# Patient Record
Sex: Male | Born: 1990 | Race: White | Hispanic: No | Marital: Single | Smoking: Current every day smoker
Health system: Southern US, Community
[De-identification: ages and names within clinical notes are randomized; demographics above are authoritative.]

## PROBLEM LIST (undated history)

## (undated) DIAGNOSIS — F419 Anxiety disorder, unspecified: Secondary | ICD-10-CM

## (undated) DIAGNOSIS — T7840XA Allergy, unspecified, initial encounter: Secondary | ICD-10-CM

## (undated) DIAGNOSIS — F32A Depression, unspecified: Secondary | ICD-10-CM

## (undated) HISTORY — PX: SHOULDER SURGERY: SHX246

## (undated) HISTORY — DX: Anxiety disorder, unspecified: F41.9

## (undated) HISTORY — DX: Depression, unspecified: F32.A

## (undated) HISTORY — DX: Allergy, unspecified, initial encounter: T78.40XA

---

## 2011-09-15 ENCOUNTER — Emergency Department: Payer: Self-pay | Admitting: Emergency Medicine

## 2018-08-29 ENCOUNTER — Ambulatory Visit
Admission: EM | Admit: 2018-08-29 | Discharge: 2018-08-29 | Disposition: A | Discharge status: Home / Self Care | Payer: Self-pay | Attending: Family Medicine | Admitting: Family Medicine

## 2018-08-29 ENCOUNTER — Ambulatory Visit (INDEPENDENT_AMBULATORY_CARE_PROVIDER_SITE_OTHER): Payer: Self-pay

## 2018-08-29 ENCOUNTER — Other Ambulatory Visit: Payer: Self-pay

## 2018-08-29 ENCOUNTER — Encounter: Payer: Self-pay | Admitting: Emergency Medicine

## 2018-08-29 DIAGNOSIS — M79645 Pain in left finger(s): Secondary | ICD-10-CM

## 2018-08-29 DIAGNOSIS — Z23 Encounter for immunization: Secondary | ICD-10-CM

## 2018-08-29 DIAGNOSIS — Y288XXA Contact with other sharp object, undetermined intent, initial encounter: Secondary | ICD-10-CM

## 2018-08-29 DIAGNOSIS — S61213A Laceration without foreign body of left middle finger without damage to nail, initial encounter: Secondary | ICD-10-CM

## 2018-08-29 MED ORDER — AMOXICILLIN-POT CLAVULANATE 875-125 MG PO TABS: 1.0000 | ORAL_TABLET | Freq: Two times a day (BID) | ORAL | 0 refills | Status: DC

## 2018-08-29 MED ORDER — TETANUS-DIPHTH-ACELL PERTUSSIS 5-2.5-18.5 LF-MCG/0.5 IM SUSP
0.5000 mL | Freq: Once | INTRAMUSCULAR | Status: AC
  Administered 2018-08-29: 0.5 mL via INTRAMUSCULAR

## 2018-08-29 NOTE — ED Triage Notes (Signed)
Pt states that he ran a screw threw his left middle finger yesterday evening, a little after 5:00. No tetanus on record. Will update today.

## 2018-08-29 NOTE — ED Provider Notes (Signed)
[facility]    CSN: [id number] Arrival date & time: 08/29/18  [id number]     History   Chief Complaint Chief Complaint  Patient presents with  . Finger Injury    left middle finger    HPI Donald Shepherd is a 28 y.o. male.   28 yo male with a c/o left middle finger injury, laceration yesterday when he ran a screw through his left middle finger. States he cleaned the area. Does not recall his last tetanus vaccine.      History reviewed. No pertinent past medical history.  There are no active problems to display for this patient.   Past Surgical History:  Procedure Laterality Date  . SHOULDER SURGERY Right        Home Medications    Prior to Admission medications   Medication Sig Start Date End Date Taking? Authorizing Provider  amoxicillin-clavulanate (AUGMENTIN) 875-125 MG tablet Take 1 tablet by mouth 2 (two) times daily. 08/29/18   Payton Mccallumonty, Lianette Broussard, MD    Family History Family History  Problem Relation Age of Onset  . Healthy Mother   . Healthy Father     Social History Social History   Tobacco Use  . Smoking status: Current Every Day Smoker    Packs/day: 0.50  . Smokeless tobacco: Current User    Types: Snuff  Substance Use Topics  . Alcohol use: Yes  . Drug use: Not Currently     Allergies   Patient has no known allergies.   Review of Systems Review of Systems   Physical Exam Triage Vital Signs ED Triage Vitals  Enc Vitals Group     BP 08/29/18 0854 128/78     Pulse Rate 08/29/18 0854 78     Resp 08/29/18 0854 18     Temp 08/29/18 0854 98.4 F (36.9 C)     Temp Source 08/29/18 0854 Oral     SpO2 08/29/18 0854 100 %     Weight 08/29/18 0850 160 lb (72.6 kg)     Height 08/29/18 0850 5\' 9"  (1.753 m)     Head Circumference --      Peak Flow --      Pain Score 08/29/18 0850 2     Pain Loc --      Pain Edu? --      Excl. in GC? --    No data found.  Updated Vital Signs BP 128/78 (BP Location: Left Arm)   Pulse 78    Temp 98.4 F (36.9 C) (Oral)   Resp 18   Ht 5\' 9"  (1.753 m)   Wt 72.6 kg   SpO2 100%   BMI 23.63 kg/m   Visual Acuity Right Eye Distance:   Left Eye Distance:   Bilateral Distance:    Right Eye Near:   Left Eye Near:    Bilateral Near:     Physical Exam Vitals signs and nursing note reviewed.  Constitutional:      General: He is not in acute distress.    Appearance: He is not toxic-appearing or diaphoretic.  Musculoskeletal:     Left hand: He exhibits laceration (ventral aspect of middle finger with approx 1.5cm laceration). He exhibits normal range of motion, no bony tenderness, normal two-point discrimination, normal capillary refill and no swelling. Normal sensation noted. Normal strength noted.     Comments: Normal range of motion and strength of left middle finger; neurovascularly intact  Neurological:     Mental Status: He is alert.  UC Treatments / Results  Labs (all labs ordered are listed, but only abnormal results are displayed) Labs Reviewed - No data to display  EKG   Radiology No results found.  Procedures Procedures (including critical care time)  Medications Ordered in UC Medications  Tdap (BOOSTRIX) injection 0.5 mL (0.5 mLs Intramuscular Given 08/29/18 0859)    Initial Impression / Assessment and Plan / UC Course  I have reviewed the triage vital signs and the nursing notes.  Pertinent labs & imaging results that were available during my care of the patient were reviewed by me and considered in my medical decision making (see chart for details).      Final Clinical Impressions(s) / UC Diagnoses   Final diagnoses:  Laceration of left middle finger without foreign body without damage to nail, initial encounter    ED Prescriptions    Medication Sig Dispense Auth. Provider   amoxicillin-clavulanate (AUGMENTIN) 875-125 MG tablet Take 1 tablet by mouth 2 (two) times daily. 14 tablet Norval Gable, MD      1. x-ray result  (negative for fracture) and diagnosis reviewed with patient; steri strip applied to loosely approximate edges and wound bandaged 2. Given Tdap 3. rx as per orders above; reviewed possible side effects, interactions, risks and benefits  4. Recommend supportive treatment with wound care 5. Recommend patient follow up with hand orthopedist this week 6. Follow-up prn if symptoms worsen or don't improve   Controlled Substance Prescriptions [place] Controlled Substance Registry consulted? Not Applicable   Norval Gable, MD 09/11/18 [phone]

## 2020-08-25 IMAGING — CR LEFT MIDDLE FINGER 2+V
2 series · 3 of 3 positions shown · non-contrast
Comparison: None.

CLINICAL DATA: Screw drilled through the anterior tip of the left
middle finger. The patient also smashed the ring finger with a
mallet 1 week ago.

EXAM:
LEFT MIDDLE FINGER 2+V

[Series 2: finger obl · 0.14mm/px · 2 of 2 slices shown]
[im 1/2]
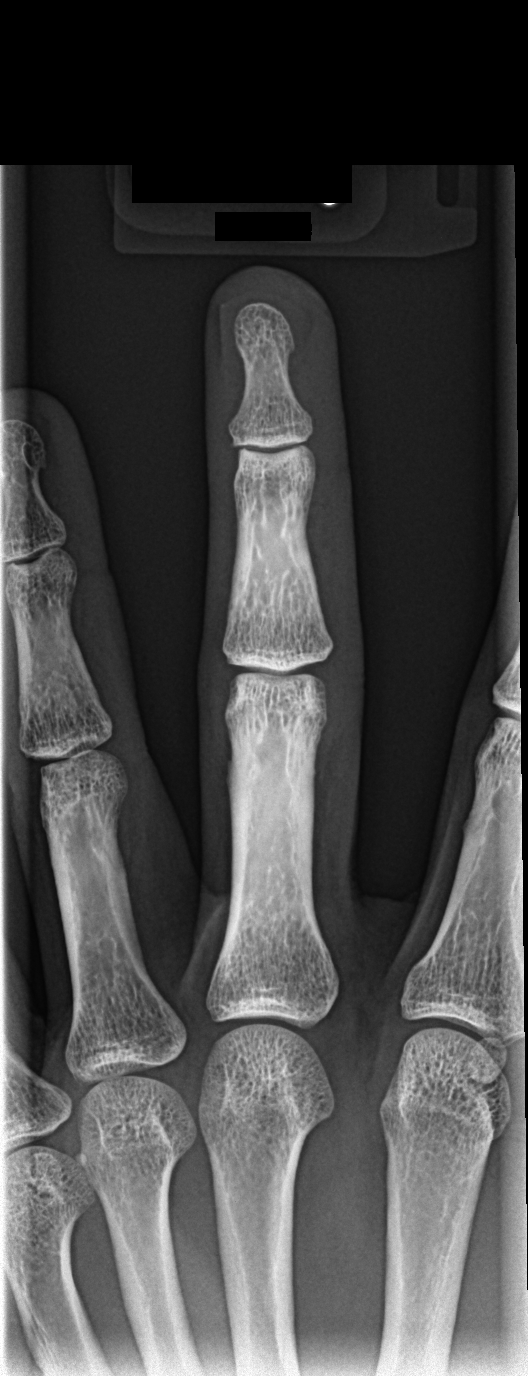
[im 2/2]
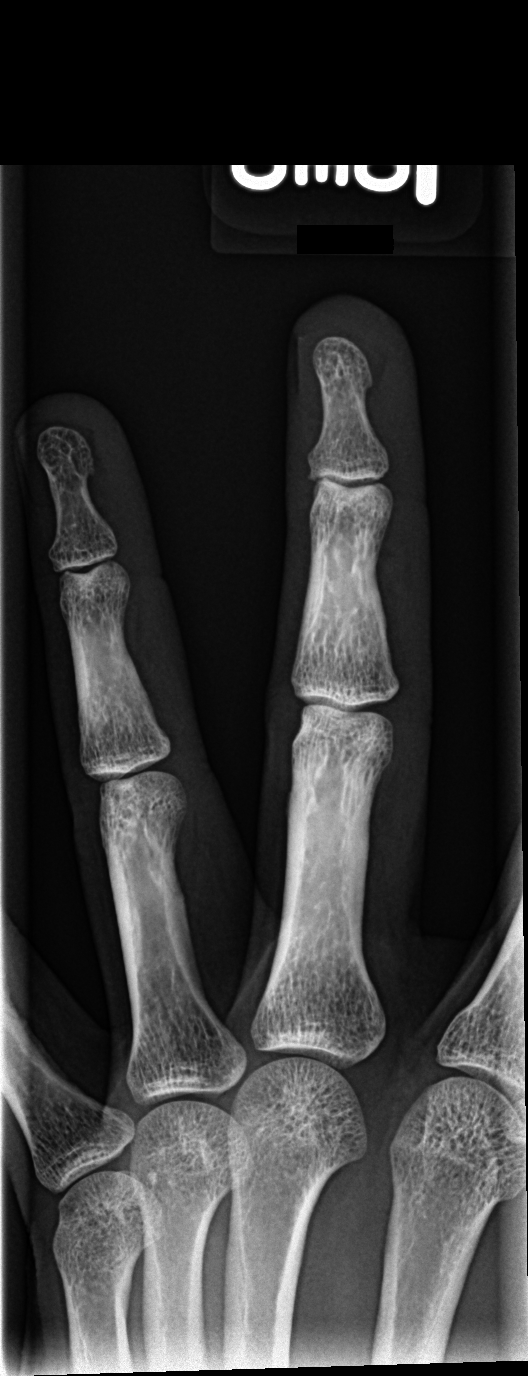

[finger lat]
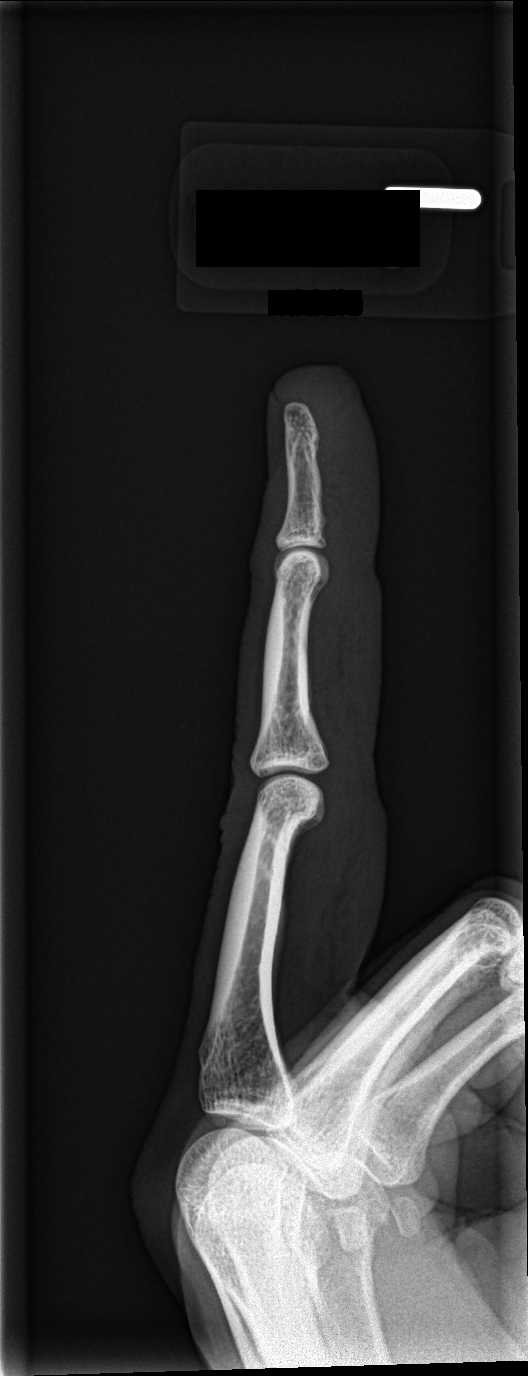

[3 of 3 positions shown; findings below may reference images not displayed]

FINDINGS: Mild soft tissue irregularity involving the ventral aspect of the
left middle finger with mild associated soft tissue swelling. No
fracture or radiopaque foreign body seen.
IMPRESSION: No fracture or radiopaque foreign body.

## 2022-09-14 ENCOUNTER — Telehealth: Payer: Self-pay | Admitting: General Practice

## 2022-09-14 NOTE — Telephone Encounter (Signed)
Routed.

## 2022-10-24 ENCOUNTER — Ambulatory Visit: Payer: 59 | Admitting: Physician Assistant

## 2022-10-24 ENCOUNTER — Encounter: Payer: Self-pay | Admitting: Physician Assistant

## 2022-10-24 VITALS — BP 110/84 | HR 86 | Temp 98.1°F | Ht 69.0 in | Wt 175.0 lb

## 2022-10-24 DIAGNOSIS — F17200 Nicotine dependence, unspecified, uncomplicated: Secondary | ICD-10-CM | POA: Diagnosis not present

## 2022-10-24 DIAGNOSIS — F411 Generalized anxiety disorder: Secondary | ICD-10-CM

## 2022-10-24 DIAGNOSIS — R011 Cardiac murmur, unspecified: Secondary | ICD-10-CM

## 2022-10-24 DIAGNOSIS — F1090 Alcohol use, unspecified, uncomplicated: Secondary | ICD-10-CM | POA: Diagnosis not present

## 2022-10-24 DIAGNOSIS — Z114 Encounter for screening for human immunodeficiency virus [HIV]: Secondary | ICD-10-CM

## 2022-10-24 DIAGNOSIS — Z1159 Encounter for screening for other viral diseases: Secondary | ICD-10-CM

## 2022-10-24 DIAGNOSIS — Z0000 Encounter for general adult medical examination without abnormal findings: Secondary | ICD-10-CM | POA: Diagnosis not present

## 2022-10-24 DIAGNOSIS — L910 Hypertrophic scar: Secondary | ICD-10-CM

## 2022-10-24 DIAGNOSIS — Z131 Encounter for screening for diabetes mellitus: Secondary | ICD-10-CM

## 2022-10-24 NOTE — Progress Notes (Addendum)
Date:  10/24/2022   Name:  Donald Shepherd   DOB:  March 29, 1990   MRN:  [id number]   Chief Complaint: Establish Care, Anxiety, and Diabetes (Family history of DM has had blood work done in a long time )  HPI Donald Shepherd is a pleasant 32 y.o. male with alcohol use disorder, tobacco use disorder, congenital heart murmur, and anxiety who presents today to establish care.  Currently drinks at least 6 beers every day.  Aware of his heart murmur, but denies chest pain or dyspnea.  Says he has an area behind the left ear which swells and shrinks and sometimes leaks purulent material.  Endorses social anxiety, both in formal and casual settings.  Uses alcohol to cope with this, but recognizes he is drinking too much and would like to reduce.  Describes "thinking too much".  Distant history of panic attacks, but none recently.  Has never used medication for this problem.  Compressed discs occasional sciatica - had a fall 7 years ago from a fishing boat landed on a dock   Medication list has been reviewed and updated.  No outpatient medications have been marked as taking for the 10/24/22 encounter (Office Visit) with Remo Lipps, PA.     Review of Systems  Constitutional:  Negative for activity change, appetite change, fatigue and unexpected weight change.  HENT:  Negative for dental problem, hearing loss and trouble swallowing.   Eyes:  Negative for visual disturbance.  Respiratory:  Negative for cough, chest tightness, shortness of breath and wheezing.   Cardiovascular:  Positive for palpitations (rare). Negative for chest pain and leg swelling.  Gastrointestinal:  Negative for abdominal distention, abdominal pain, blood in stool, constipation and diarrhea.  Endocrine: Negative for polydipsia, polyphagia and polyuria.  Genitourinary:  Negative for difficulty urinating, dysuria, enuresis, frequency, hematuria and testicular pain.  Musculoskeletal:  Negative for arthralgias, gait problem and  joint swelling.  Skin:  Negative for rash and wound.       Cyst behind ear?  Neurological:  Negative for dizziness, syncope, weakness and headaches.  Psychiatric/Behavioral:  Negative for behavioral problems and sleep disturbance. The patient is nervous/anxious.     Patient Active Problem List   Diagnosis Date Noted   Alcohol use disorder 10/24/2022   Tobacco use disorder 10/24/2022   Systolic murmur 10/24/2022    No Known Allergies  Immunization History  Administered Date(s) Administered   Tdap 08/29/2018    Past Surgical History:  Procedure Laterality Date   SHOULDER SURGERY Right     Social History   Tobacco Use   Smoking status: Every Day    Current packs/day: 1.00    Average packs/day: 1 pack/day for 15.7 years (15.7 ttl pk-yrs)    Types: Cigarettes    Start date: 02/15/2007   Smokeless tobacco: Current    Types: Snuff  Vaping Use   Vaping status: Never Used  Substance Use Topics   Alcohol use: Yes    Alcohol/week: 42.0 standard drinks of alcohol    Types: 42 Cans of beer per week   Drug use: Not Currently    Types: Marijuana    Family History  Problem Relation Age of Onset   Healthy Mother    Asthma Mother    COPD Mother    Healthy Father    Diabetes Father    Cancer Maternal Grandfather    COPD Maternal Grandfather    Stroke Maternal Grandfather    Stroke Maternal Grandmother  Diabetes Paternal Grandfather    Diabetes Paternal Uncle         10/24/2022    9:31 AM  GAD 7 : Generalized Anxiety Score  Nervous, Anxious, on Edge 2  Control/stop worrying 3  Worry too much - different things 3  Trouble relaxing 1  Restless 1  Easily annoyed or irritable 1  Afraid - awful might happen 0  Total GAD 7 Score 11  Anxiety Difficulty Somewhat difficult       10/24/2022    9:31 AM  Depression screen PHQ 2/9  Decreased Interest 1  Down, Depressed, Hopeless 0  PHQ - 2 Score 1  Altered sleeping 0  Tired, decreased energy 2  Change in appetite 1   Feeling bad or failure about yourself  0  Trouble concentrating 0  Moving slowly or fidgety/restless 0  Suicidal thoughts 0  PHQ-9 Score 4  Difficult doing work/chores Not difficult at all    BP Readings from Last 3 Encounters:  10/24/22 110/84  08/29/18 128/78    Wt Readings from Last 3 Encounters:  10/24/22 175 lb (79.4 kg)  08/29/18 160 lb (72.6 kg)    BP 110/84   Pulse 86   Temp 98.1 F (36.7 C) (Oral)   Ht 5\' 9"  (1.753 m)   Wt 175 lb (79.4 kg)   SpO2 98%   BMI 25.84 kg/m   Physical Exam Vitals and nursing note reviewed.  Constitutional:      Appearance: Normal appearance.  HENT:     Ears:     Comments: EAC clear bilaterally with good view of TM which is without effusion or erythema.  There is a flesh-colored soft cyst vs keloid behind the left ear without tenderness or active drainage.     Nose: Nose normal.     Mouth/Throat:     Mouth: Mucous membranes are moist. No oral lesions.     Dentition: Normal dentition.     Pharynx: No posterior oropharyngeal erythema.     Comments: Nicotine staining of teeth Eyes:     Extraocular Movements: Extraocular movements intact.     Conjunctiva/sclera: Conjunctivae normal.     Pupils: Pupils are equal, round, and reactive to light.  Neck:     Thyroid: No thyromegaly.  Cardiovascular:     Rate and Rhythm: Normal rate and regular rhythm.     Heart sounds: Murmur heard.     Systolic (tricuspid and mitral zones) murmur is present with a grade of 3/6.     No friction rub. No gallop.     Comments: Pulses 2+ at radial, PT, DP bilaterally. No carotid bruit. No peripheral edema Pulmonary:     Effort: Pulmonary effort is normal.     Breath sounds: Normal breath sounds.  Abdominal:     General: Bowel sounds are normal.     Palpations: Abdomen is soft. There is no mass.     Tenderness: There is no abdominal tenderness.  Genitourinary:    Comments: Deferred. Reviewed self-exam technique Musculoskeletal:     Comments: Full  ROM with strength 5/5 bilateral upper and lower extremities  Lymphadenopathy:     Cervical: No cervical adenopathy.  Skin:    General: Skin is warm.     Capillary Refill: Capillary refill takes less than 2 seconds.     Findings: No lesion or rash.  Neurological:     Mental Status: He is alert and oriented to person, place, and time.     Gait: Gait is intact.  Psychiatric:        Mood and Affect: Mood normal.        Behavior: Behavior normal.     Vision Screening   Right eye Left eye Both eyes  Without correction 20/25- 20/15 20/15  With correction       Recent Labs  No results found for: "NA", "K", "CL", "CO2", "GLUCOSE", "BUN", "CREATININE", "CALCIUM", "PROT", "ALBUMIN", "AST", "ALT", "ALKPHOS", "BILITOT", "GFRNONAA", "GFRAA"  No results found for: "WBC", "HGB", "HCT", "MCV", "PLT" No results found for: "HGBA1C" No results found for: "CHOL", "HDL", "LDLCALC", "LDLDIRECT", "TRIG", "CHOLHDL" No results found for: "TSH"   Assessment and Plan:  1. Annual physical exam Seemingly healthy patient with no abnormalities on exam. Encouraged healthy lifestyle including regular physical activity and consumption of whole fruits and vegetables. Encouraged routine dental and eye exams. Vaccinations up to date.   - CBC with Differential/Platelet - Comprehensive metabolic panel - TSH  2. Alcohol use disorder Prioritize gradual reduction of alcohol intake.  Patient has good insight.  We discussed option for pharmacotherapy if necessary, but will try without for now.  3. Tobacco use disorder Previously had sleep disturbance on nicotine patch.  Option for nicotine gum or lozenges in the future, but will focus on reducing alcohol for now.  4. Systolic murmur Discussed with patient, recommended baseline echo as the murmur is clearly audible, but deferred at this time.  Currently asymptomatic, patient to let me know if this changes  5. Keloid Could be keloid versus cyst/recurrent abscess.   Refer to dermatology for further evaluation and management - Ambulatory referral to Dermatology  6. Screening for diabetes mellitus Will check fasting glucose today.  If elevated will follow with A1c.  7. Screening for HIV (human immunodeficiency virus) One-time HIV screen today - HIV Antibody (routine testing w rflx)  8. Need for hepatitis C screening test One-time hep C screen today - Hepatitis C antibody  9. Generalized anxiety disorder Could be caused or exacerbated by substance use including alcohol and nicotine.  Will hold off on pharmacotherapy at this time while we work on reduction/elimination of those substances.  We discussed the importance of routine physical activity in reducing stress and anxiety.  May find benefit from yoga, mindfulness, and breathing techniques including box breathing as nonpharmacologic options for reducing anxiety. We did briefly discuss some of the medications which might be helpful to him in the future, but all of which would require significant reduction in alcohol intake.  These options include SSRI/SNRI, hydroxyzine as needed, or propranolol for the social anxiety component.    Return in about 3 months (around 01/23/2023) for OV f/u chonic conditions.    Alvester Morin, PA-C, DMSc, Nutritionist [facility] Primary Care and Sports Medicine [facility] [phone]

## 2022-10-24 NOTE — Patient Instructions (Addendum)
-  It was a pleasure to see you today! Please review your visit summary for helpful information -Lab results are usually available within 1-2 days and we will call once reviewed -I would encourage you to follow your care via MyChart where you can access lab results, notes, messages, and more -If you feel that we did a nice job today, please complete your after-visit survey and leave [place] a Google review! Your CMA today was Mariann Barter and your provider was Alvester Morin, PA-C, DMSc -Please return for follow-up in about 3 months  Remember the importance of routine physical activity in reducing stress and anxiety.  May find benefit from yoga, mindfulness, and breathing techniques including box breathing as nonpharmacologic options for reducing anxiety.  Try to reduce alcohol use gradually.

## 2022-10-25 LAB — CBC WITH DIFFERENTIAL/PLATELET
Basophils Absolute: 0.1 10*3/uL (ref 0.0–0.2)
Basos: 1 %
EOS (ABSOLUTE): 0.2 10*3/uL (ref 0.0–0.4)
Eos: 2 %
Hematocrit: 53.1 % — ABNORMAL HIGH (ref 37.5–51.0)
Hemoglobin: 18.1 g/dL — ABNORMAL HIGH (ref 13.0–17.7)
Immature Grans (Abs): 0 10*3/uL (ref 0.0–0.1)
Immature Granulocytes: 0 %
Lymphocytes Absolute: 2.6 10*3/uL (ref 0.7–3.1)
Lymphs: 36 %
MCH: 31.6 pg (ref 26.6–33.0)
MCHC: 34.1 g/dL (ref 31.5–35.7)
MCV: 93 fL (ref 79–97)
Monocytes Absolute: 0.8 10*3/uL (ref 0.1–0.9)
Monocytes: 11 %
Neutrophils Absolute: 3.7 10*3/uL (ref 1.4–7.0)
Neutrophils: 50 %
Platelets: 264 10*3/uL (ref 150–450)
RBC: 5.73 x10E6/uL (ref 4.14–5.80)
RDW: 12 % (ref 11.6–15.4)
WBC: 7.3 10*3/uL (ref 3.4–10.8)

## 2022-10-25 LAB — HEPATITIS C ANTIBODY: Hep C Virus Ab: NONREACTIVE

## 2022-10-25 LAB — COMPREHENSIVE METABOLIC PANEL
ALT: 27 IU/L (ref 0–44)
AST: 25 IU/L (ref 0–40)
Albumin: 4.7 g/dL (ref 4.1–5.1)
Alkaline Phosphatase: 103 IU/L (ref 44–121)
BUN/Creatinine Ratio: 10 (ref 9–20)
BUN: 9 mg/dL (ref 6–20)
Bilirubin Total: 0.7 mg/dL (ref 0.0–1.2)
CO2: 21 mmol/L (ref 20–29)
Calcium: 9.7 mg/dL (ref 8.7–10.2)
Chloride: 100 mmol/L (ref 96–106)
Creatinine, Ser: 0.88 mg/dL (ref 0.76–1.27)
Globulin, Total: 2.4 g/dL (ref 1.5–4.5)
Glucose: 83 mg/dL (ref 70–99)
Potassium: 4.6 mmol/L (ref 3.5–5.2)
Sodium: 138 mmol/L (ref 134–144)
Total Protein: 7.1 g/dL (ref 6.0–8.5)
eGFR: 116 mL/min/{1.73_m2} (ref >59)

## 2022-10-25 LAB — HIV ANTIBODY (ROUTINE TESTING W REFLEX): HIV Screen 4th Generation wRfx: NONREACTIVE

## 2022-10-25 LAB — TSH: TSH: 0.921 u[IU]/mL (ref 0.450–4.500)

## 2022-11-02 ENCOUNTER — Telehealth: Payer: Self-pay

## 2022-11-02 NOTE — Telephone Encounter (Signed)
Copied from CRM [id number]. Topic: General - Other >> Nov 02, 2022  1:57 PM [place] B wrote: Reason for CRM: pt called in , was seen 09/09 and he called about Dr Mordecai Maes wanting to check his hear murmur. He wants to know what is the next step with this. Please cb

## 2022-11-08 ENCOUNTER — Other Ambulatory Visit: Payer: Self-pay | Admitting: Physician Assistant

## 2022-11-08 DIAGNOSIS — R011 Cardiac murmur, unspecified: Secondary | ICD-10-CM

## 2022-12-12 ENCOUNTER — Ambulatory Visit
Admission: RE | Admit: 2022-12-12 | Discharge: 2022-12-12 | Disposition: A | Discharge status: Home / Self Care | Payer: 59 | Source: Ambulatory Visit | Attending: Physician Assistant | Admitting: Physician Assistant

## 2022-12-12 DIAGNOSIS — R011 Cardiac murmur, unspecified: Secondary | ICD-10-CM

## 2022-12-12 DIAGNOSIS — I351 Nonrheumatic aortic (valve) insufficiency: Secondary | ICD-10-CM | POA: Diagnosis present

## 2022-12-12 DIAGNOSIS — F32A Depression, unspecified: Secondary | ICD-10-CM | POA: Insufficient documentation

## 2022-12-12 DIAGNOSIS — F419 Anxiety disorder, unspecified: Secondary | ICD-10-CM | POA: Insufficient documentation

## 2022-12-12 LAB — ECHOCARDIOGRAM COMPLETE
AR max vel: 2.7 cm2
AV Area VTI: 2.46 cm2
AV Area mean vel: 2.35 cm2
AV Mean grad: 4 mm[Hg]
AV Peak grad: 6.5 mm[Hg]
Ao pk vel: 1.27 m/s
Area-P 1/2: 4.46 cm2
MV VTI: 3.22 cm2
S' Lateral: 3 cm

## 2023-01-23 ENCOUNTER — Ambulatory Visit: Payer: 59 | Admitting: Physician Assistant

## 2023-01-23 ENCOUNTER — Encounter: Payer: Self-pay | Admitting: Physician Assistant

## 2023-01-23 VITALS — BP 132/90 | HR 98 | Temp 98.3°F | Ht 69.0 in | Wt 180.0 lb

## 2023-01-23 DIAGNOSIS — F1090 Alcohol use, unspecified, uncomplicated: Secondary | ICD-10-CM | POA: Diagnosis not present

## 2023-01-23 DIAGNOSIS — F411 Generalized anxiety disorder: Secondary | ICD-10-CM

## 2023-01-23 DIAGNOSIS — F17200 Nicotine dependence, unspecified, uncomplicated: Secondary | ICD-10-CM

## 2023-01-23 DIAGNOSIS — I517 Cardiomegaly: Secondary | ICD-10-CM | POA: Insufficient documentation

## 2023-01-23 DIAGNOSIS — R011 Cardiac murmur, unspecified: Secondary | ICD-10-CM

## 2023-01-23 DIAGNOSIS — J209 Acute bronchitis, unspecified: Secondary | ICD-10-CM

## 2023-01-23 MED ORDER — PROMETHAZINE-DM 6.25-15 MG/5ML PO SYRP: 5.0000 mL | ORAL_SOLUTION | Freq: Four times a day (QID) | ORAL | 0 refills | Status: AC | PRN

## 2023-01-23 MED ORDER — PROPRANOLOL HCL 40 MG PO TABS: 40.0000 mg | ORAL_TABLET | Freq: Two times a day (BID) | ORAL | 1 refills | Status: DC | PRN

## 2023-01-23 MED ORDER — BENZONATATE 200 MG PO CAPS: 200.0000 mg | ORAL_CAPSULE | Freq: Three times a day (TID) | ORAL | 0 refills | Status: DC | PRN

## 2023-01-23 NOTE — Patient Instructions (Signed)
-  It was a pleasure to see you today! Please review your visit summary for helpful information -I would encourage you to follow your care via MyChart where you can access lab results, notes, messages, and more -If you feel that we did a nice job today, please complete your after-visit survey and leave us a Google review! Your CMA today was Kieandra and your provider was Dan Waddell, PA-C, DMSc -Please return for follow-up in about 4 weeks  

## 2023-01-23 NOTE — Progress Notes (Signed)
Date:  01/23/2023   Name:  Donald Shepherd   DOB:  February 09, 1991   MRN:  [id number]   Chief Complaint: Alcohol Problem and Nicotine Dependence  HPI Masood presents today for 8-month follow-up on chronic conditions including alcohol use disorder, tobacco use disorder, GAD, and systolic murmur.  The murmur was discovered incidentally at our physical 10/24/2022 and followed by echo which revealed "mild concentric left ventricular hypertrophy".  He has not completed cardiology consult yet.  Last visit he endorsed consuming up to 42 beers weekly, but he has significantly reduced alcohol intake now reporting about 4 beers per week.  Last visit we held off on any pharmacotherapy for anxiety, opting instead to work on lifestyle changes to include alcohol reduction, improved sleep, and increasing physical activity.  He is interested in medication options at this time, but wants to approach this conservatively.  Separately, he endorses 2 weeks persistent cough unchanged over the last 72 hours.  It is disruptive to sleep.  He is still smoking 1 pack/day.  Says a coworker was also sick.   Medication list has been reviewed and updated.  Current Meds  Medication Sig   benzonatate (TESSALON) 200 MG capsule Take 1 capsule (200 mg total) by mouth 3 (three) times daily as needed for cough.   promethazine-dextromethorphan (PROMETHAZINE-DM) 6.25-15 MG/5ML syrup Take 5 mLs by mouth 4 (four) times daily as needed for up to 9 days for cough.   propranolol (INDERAL) 40 MG tablet Take 1 tablet (40 mg total) by mouth 2 (two) times daily as needed (for anxiety).     Review of Systems  Patient Active Problem List   Diagnosis Date Noted   Alcohol use disorder 10/24/2022   Tobacco use disorder 10/24/2022   Systolic murmur 10/24/2022   Generalized anxiety disorder 10/24/2022    No Known Allergies  Immunization History  Administered Date(s) Administered   Tdap 08/29/2018    Past Surgical History:   Procedure Laterality Date   SHOULDER SURGERY Right     Social History   Tobacco Use   Smoking status: Every Day    Current packs/day: 1.00    Average packs/day: 1 pack/day for 15.9 years (15.9 ttl pk-yrs)    Types: Cigarettes    Start date: 02/15/2007   Smokeless tobacco: Current    Types: Snuff  Vaping Use   Vaping status: Never Used  Substance Use Topics   Alcohol use: Yes    Alcohol/week: 4.0 standard drinks of alcohol    Types: 4 Cans of beer per week    Comment: 3-4 for a week the past month   Drug use: Not Currently    Types: Marijuana    Family History  Problem Relation Age of Onset   Healthy Mother    Asthma Mother    COPD Mother    Healthy Father    Diabetes Father    Cancer Maternal Grandfather    COPD Maternal Grandfather    Stroke Maternal Grandfather    Stroke Maternal Grandmother    Diabetes Paternal Grandfather    Diabetes Paternal Uncle         01/23/2023    3:01 PM 10/24/2022    9:31 AM  GAD 7 : Generalized Anxiety Score  Nervous, Anxious, on Edge 2 2  Control/stop worrying 3 3  Worry too much - different things 3 3  Trouble relaxing 1 1  Restless 0 1  Easily annoyed or irritable 1 1  Afraid - awful might  happen 0 0  Total GAD 7 Score 10 11  Anxiety Difficulty Somewhat difficult Somewhat difficult       01/23/2023    3:00 PM 10/24/2022    9:31 AM  Depression screen PHQ 2/9  Decreased Interest 0 1  Down, Depressed, Hopeless 0 0  PHQ - 2 Score 0 1  Altered sleeping 2 0  Tired, decreased energy 2 2  Change in appetite 1 1  Feeling bad or failure about yourself  0 0  Trouble concentrating 0 0  Moving slowly or fidgety/restless 0 0  Suicidal thoughts 0 0  PHQ-9 Score 5 4  Difficult doing work/chores Not difficult at all Not difficult at all    BP Readings from Last 3 Encounters:  01/23/23 (!) 132/90  10/24/22 110/84  08/29/18 128/78    Wt Readings from Last 3 Encounters:  01/23/23 180 lb (81.6 kg)  10/24/22 175 lb (79.4 kg)   08/29/18 160 lb (72.6 kg)    BP (!) 132/90   Pulse 98   Temp 98.3 F (36.8 C) (Oral)   Ht 5\' 9"  (1.753 m)   Wt 180 lb (81.6 kg)   SpO2 96%   BMI 26.58 kg/m   Physical Exam Vitals and nursing note reviewed.  Constitutional:      Appearance: Normal appearance.  Cardiovascular:     Rate and Rhythm: Normal rate and regular rhythm.     Heart sounds: Murmur heard.     Systolic murmur is present with a grade of 3/6.     No friction rub. No gallop.     Comments: Tricuspid and mitral area Pulmonary:     Effort: Pulmonary effort is normal.     Breath sounds: Normal breath sounds.  Abdominal:     General: There is no distension.  Musculoskeletal:        General: Normal range of motion.  Skin:    General: Skin is warm and dry.  Neurological:     Mental Status: He is alert and oriented to person, place, and time.     Gait: Gait is intact.  Psychiatric:        Mood and Affect: Mood and affect normal.     Recent Labs     Component Value Date/Time   NA 138 10/24/2022 1053   K 4.6 10/24/2022 1053   CL 100 10/24/2022 1053   CO2 21 10/24/2022 1053   GLUCOSE 83 10/24/2022 1053   BUN 9 10/24/2022 1053   CREATININE 0.88 10/24/2022 1053   CALCIUM 9.7 10/24/2022 1053   PROT 7.1 10/24/2022 1053   ALBUMIN 4.7 10/24/2022 1053   AST 25 10/24/2022 1053   ALT 27 10/24/2022 1053   ALKPHOS 103 10/24/2022 1053   BILITOT 0.7 10/24/2022 1053    Lab Results  Component Value Date   WBC 7.3 10/24/2022   HGB 18.1 (H) 10/24/2022   HCT 53.1 (H) 10/24/2022   MCV 93 10/24/2022   PLT 264 10/24/2022   No results found for: "HGBA1C" No results found for: "CHOL", "HDL", "LDLCALC", "LDLDIRECT", "TRIG", "CHOLHDL" Lab Results  Component Value Date   TSH 0.921 10/24/2022     Assessment and Plan:  1. Generalized anxiety disorder Discussed initial options for pharmacotherapy.  Through shared decision making we decided to try low dose propranolol 1-2 times daily as below.   - propranolol  (INDERAL) 40 MG tablet; Take 1 tablet (40 mg total) by mouth 2 (two) times daily as needed (for anxiety).  Dispense: 60 tablet;  Refill: 1  2. Alcohol use disorder Congratulated on alcohol reduction.  Advised to continue to limit use.  3. Tobacco use disorder Encouraged smoking cessation.  4. Mild concentric left ventricular hypertrophy (LVH) Initiating referral to cardiology today, even if just for a single consult.  Patient in agreement. - propranolol (INDERAL) 40 MG tablet; Take 1 tablet (40 mg total) by mouth 2 (two) times daily as needed (for anxiety).  Dispense: 60 tablet; Refill: 1 - Ambulatory referral to Cardiology  5. Systolic murmur Plan as above - Ambulatory referral to Cardiology  6. Acute bronchitis, unspecified organism Lungs sound good today, oxygenating fairly well, no fever.  Discouraged cigarette smoking while sick.  Prescribing cough suppressant medications as below.  Advised bronchitis can take 2 to 4 weeks before it begins to resolve, but irritants like cigarette smoke might slow recovery. - benzonatate (TESSALON) 200 MG capsule; Take 1 capsule (200 mg total) by mouth 3 (three) times daily as needed for cough.  Dispense: 30 capsule; Refill: 0 - promethazine-dextromethorphan (PROMETHAZINE-DM) 6.25-15 MG/5ML syrup; Take 5 mLs by mouth 4 (four) times daily as needed for up to 9 days for cough.  Dispense: 118 mL; Refill: 0   Return in about 4 weeks (around 02/20/2023) for OV f/u cough, anxiety, heart.    Alvester Morin, PA-C, DMSc, Nutritionist [facility] Primary Care and Sports Medicine [facility] [phone]

## 2023-03-20 ENCOUNTER — Other Ambulatory Visit: Payer: Self-pay | Admitting: Physician Assistant

## 2023-03-20 DIAGNOSIS — I517 Cardiomegaly: Secondary | ICD-10-CM

## 2023-03-20 DIAGNOSIS — F411 Generalized anxiety disorder: Secondary | ICD-10-CM

## 2023-03-21 NOTE — Telephone Encounter (Signed)
 Requested medications are due for refill today.  yes  Requested medications are on the active medications list.  yes  Last refill. 01/23/2023 #60 1 rf  Future visit scheduled.   no  Notes to clinic.  New medication to pt. Please review for refill.    Requested Prescriptions  Pending Prescriptions Disp Refills   propranolol  (INDERAL ) 40 MG tablet [Pharmacy Med Name: PROPRANOLOL  40MG  TABLETS] 60 tablet 1    Sig: TAKE 1 TABLET(40 MG) BY MOUTH TWICE DAILY AS NEEDED FOR ANXIETY     Cardiovascular:  Beta Blockers Failed - 03/21/2023 10:33 AM      Failed - Last BP in normal range    BP Readings from Last 1 Encounters:  01/23/23 (!) 132/90         Passed - Last Heart Rate in normal range    Pulse Readings from Last 1 Encounters:  01/23/23 98         Passed - Valid encounter within last 6 months    Recent Outpatient Visits           1 month ago Generalized anxiety disorder   [facility] at [facility], [place], [place]   4 months ago Annual physical exam   [facility] at [facility], [place], [place]       Future Appointments             In 2 weeks End, Lonni, MD [facility] at [facility]

## 2023-04-04 ENCOUNTER — Ambulatory Visit
Admission: EM | Admit: 2023-04-04 | Discharge: 2023-04-04 | Disposition: A | Discharge status: Home / Self Care | Payer: 59 | Attending: Emergency Medicine | Admitting: Emergency Medicine

## 2023-04-04 DIAGNOSIS — J209 Acute bronchitis, unspecified: Secondary | ICD-10-CM | POA: Insufficient documentation

## 2023-04-04 DIAGNOSIS — U071 COVID-19: Secondary | ICD-10-CM | POA: Insufficient documentation

## 2023-04-04 LAB — SARS CORONAVIRUS 2 BY RT PCR: SARS Coronavirus 2 by RT PCR: POSITIVE — AB

## 2023-04-04 MED ORDER — PROMETHAZINE-DM 6.25-15 MG/5ML PO SYRP: 5.0000 mL | ORAL_SOLUTION | Freq: Four times a day (QID) | ORAL | 0 refills | Status: AC | PRN

## 2023-04-04 MED ORDER — IPRATROPIUM BROMIDE 0.06 % NA SOLN: 2.0000 | Freq: Four times a day (QID) | NASAL | 12 refills | Status: AC

## 2023-04-04 MED ORDER — BENZONATATE 200 MG PO CAPS: 200.0000 mg | ORAL_CAPSULE | Freq: Three times a day (TID) | ORAL | 0 refills | Status: AC | PRN

## 2023-04-04 NOTE — ED Triage Notes (Signed)
 Pt presents to UC d/t testing + for covid yesterday. Is c/o chills,HA,bodyaches & congestion.

## 2023-04-04 NOTE — Discharge Instructions (Addendum)
 CDC guidelines state that you must wear a mask for the first 5 days of symptoms when you are around other people.  After 5 days you no longer need to mask as you are no longer considered infectious.  There is no longer need to quarantine unless you have a fever.  If you do have a fever then you need to quarantine until you have been fever free for 24 hours without taking Tylenol and/or ibuprofen.  Use over-the-counter Tylenol and/or ibuprofen according to the package instructions as needed for fever and pain.  Use the Atrovent nasal spray, 2 squirts up each nostril every 6 hours, as needed for nasal congestion and runny nose.  Use the Tessalon Perles every 8 hours during the day as needed for cough.  Take them with a small sip of water.  You may experience numbness to the base of your tongue or metallic taste in her mouth, this is normal.  Use the Promethazine DM cough syrup at bedtime as needed for cough and congestion.  Be mindful this medication will make you sleepy.  If you develop any worsening respiratory symptoms such as shortness of breath, shortness of breath at rest, feel as though you cannot catch your breath, you are unable to speak in full sentences, or, as a late sign, your lips begin turning blue you need to call 911 and go to the ER for evaluation.

## 2023-04-04 NOTE — ED Provider Notes (Signed)
 [facility]    CSN: [id number] Arrival date & time: 04/04/23  6213      History   Chief Complaint Chief Complaint  Patient presents with   Covid Positive   Chills   Headache   Generalized Body Aches    HPI Donald Shepherd is a 33 y.o. male.   HPI  33 year old male with past medical history significant for anxiety, depression, systolic murmur, and mild left ventricular hypertrophy presents for evaluation after testing COVID-positive yesterday.  He reports that his grandmother, aunt, and uncle have all tested positive and he was around them recently.  Symptoms began midday yesterday but intensified as the day went on and include headache, body aches, chills, nasal congestion, sore throat, infrequent nonproductive cough.  He denies any fever, runny nose, ear pain, shortness of breath, wheezing, or GI complaints.  Past Medical History:  Diagnosis Date   Allergy    Anxiety    Depression     Patient Active Problem List   Diagnosis Date Noted   Mild concentric left ventricular hypertrophy (LVH) 01/23/2023   Alcohol use disorder 10/24/2022   Tobacco use disorder 10/24/2022   Systolic murmur 10/24/2022   Generalized anxiety disorder 10/24/2022    Past Surgical History:  Procedure Laterality Date   SHOULDER SURGERY Right        Home Medications    Prior to Admission medications   Medication Sig Start Date End Date Taking? Authorizing Provider  ipratropium (ATROVENT) 0.06 % nasal spray Place 2 sprays into both nostrils 4 (four) times daily. 04/04/23  Yes Becky Augusta, NP  promethazine-dextromethorphan (PROMETHAZINE-DM) 6.25-15 MG/5ML syrup Take 5 mLs by mouth 4 (four) times daily as needed. 04/04/23  Yes Becky Augusta, NP  propranolol (INDERAL) 40 MG tablet TAKE 1 TABLET(40 MG) BY MOUTH TWICE DAILY AS NEEDED FOR ANXIETY 03/21/23  Yes Remo Lipps, PA  benzonatate (TESSALON) 200 MG capsule Take 1 capsule (200 mg total) by mouth 3 (three) times daily as  needed for cough. 04/04/23   Becky Augusta, NP    Family History Family History  Problem Relation Age of Onset   Healthy Mother    Asthma Mother    COPD Mother    Healthy Father    Diabetes Father    Cancer Maternal Grandfather    COPD Maternal Grandfather    Stroke Maternal Grandfather    Stroke Maternal Grandmother    Diabetes Paternal Grandfather    Diabetes Paternal Uncle     Social History Social History   Tobacco Use   Smoking status: Every Day    Current packs/day: 1.00    Average packs/day: 1 pack/day for 16.1 years (16.1 ttl pk-yrs)    Types: Cigarettes    Start date: 02/15/2007   Smokeless tobacco: Current    Types: Snuff  Vaping Use   Vaping status: Never Used  Substance Use Topics   Alcohol use: Yes    Alcohol/week: 4.0 standard drinks of alcohol    Types: 4 Cans of beer per week    Comment: 3-4 for a week the past month   Drug use: Not Currently    Types: Marijuana     Allergies   Patient has no known allergies.   Review of Systems Review of Systems  Constitutional:  Negative for fever.  HENT:  Positive for congestion and sore throat. Negative for ear pain and rhinorrhea.   Respiratory:  Positive for cough. Negative for shortness of breath and wheezing.   Gastrointestinal:  Negative for diarrhea, nausea and vomiting.  Musculoskeletal:  Positive for arthralgias and myalgias.  Skin:  Negative for rash.  Neurological:  Positive for headaches.     Physical Exam Triage Vital Signs ED Triage Vitals [04/04/23 0817]  Encounter Vitals Group     BP      Systolic BP Percentile      Diastolic BP Percentile      Pulse      Resp 16     Temp      Temp Source Oral     SpO2      Weight      Height      Head Circumference      Peak Flow      Pain Score      Pain Loc      Pain Education      Exclude from Growth Chart    No data found.  Updated Vital Signs BP 102/62 (BP Location: Left Arm)   Pulse (!) 106   Temp 99.4 F (37.4 C) (Oral)   Resp  16   Ht 5\' 9"  (1.753 m)   Wt 170 lb (77.1 kg)   SpO2 95%   BMI 25.10 kg/m   Visual Acuity Right Eye Distance:   Left Eye Distance:   Bilateral Distance:    Right Eye Near:   Left Eye Near:    Bilateral Near:     Physical Exam Vitals and nursing note reviewed.  Constitutional:      Appearance: Normal appearance. He is not ill-appearing.  HENT:     Head: Normocephalic and atraumatic.     Right Ear: Tympanic membrane, ear canal and external ear normal. There is no impacted cerumen.     Left Ear: Tympanic membrane, ear canal and external ear normal. There is no impacted cerumen.     Nose: Congestion and rhinorrhea present.     Comments: Nasal mucosa is erythematous and edematous with scant clear discharge in both nares.    Mouth/Throat:     Mouth: Mucous membranes are moist.     Pharynx: Oropharynx is clear. Posterior oropharyngeal erythema present. No oropharyngeal exudate.     Comments: Tonsillar pillars are unremarkable.  Erythema to the posterior oropharynx with clear postnasal drip. Cardiovascular:     Rate and Rhythm: Normal rate and regular rhythm.     Pulses: Normal pulses.     Heart sounds: Normal heart sounds. No murmur heard.    No friction rub. No gallop.  Pulmonary:     Effort: Pulmonary effort is normal.     Breath sounds: Normal breath sounds. No wheezing, rhonchi or rales.  Musculoskeletal:     Cervical back: Normal range of motion and neck supple. No tenderness.  Lymphadenopathy:     Cervical: No cervical adenopathy.  Skin:    General: Skin is warm and dry.     Capillary Refill: Capillary refill takes less than 2 seconds.     Findings: No rash.  Neurological:     General: No focal deficit present.     Mental Status: He is alert and oriented to person, place, and time.      UC Treatments / Results  Labs (all labs ordered are listed, but only abnormal results are displayed) Labs Reviewed  SARS CORONAVIRUS 2 BY RT PCR - Abnormal; Notable for the  following components:      Result Value   SARS Coronavirus 2 by RT PCR POSITIVE (*)    All other components  within normal limits    EKG   Radiology No results found.  Procedures Procedures (including critical care time)  Medications Ordered in UC Medications - No data to display  Initial Impression / Assessment and Plan / UC Course  I have reviewed the triage vital signs and the nursing notes.  Pertinent labs & imaging results that were available during my care of the patient were reviewed by me and considered in my medical decision making (see chart for details).   Patient is a nontoxic-appearing 33 year old male presenting for evaluation of COVID symptoms as outlined HPI above.  His physical exam does reveal inflammation of his upper respiratory tract with inflamed nasal mucosa.  Very scant clear nasal discharge and both nares as well as erythema with mild clear postnasal drip in the posterior pharynx.  Cardiopulmonary exam is benign.  Patient is not in any acute distress and is able to speak in full sentence with dyspnea or tachypnea.  He is afebrile but does have an elevated temp of 99.4.  Respiratory to 16 with a 95% room air oxygen saturation.  I will order a confirmatory COVID PCR.  I discussed with the patient that the guidelines no longer require mandatory 5-day quarantine and that you only have to quarantine if you are febrile.  However, you need to wear mask around other people for the first 5 days of symptoms.  COVID PCR is positive.  I will discharge patient with a diagnosis of COVID-19.  He has very mild symptoms and I do not feel that antivirals are necessary at this time.  I will discharge him home with Atrovent nasal spray to help with his nasal congestion along with Tessalon Perles and Promethazine DM cough syrup for cough and congestion.  ER and return precautions reviewed.   Final Clinical Impressions(s) / UC Diagnoses   Final diagnoses:  COVID-19      Discharge Instructions      CDC guidelines state that you must wear a mask for the first 5 days of symptoms when you are around other people.  After 5 days you no longer need to mask as you are no longer considered infectious.  There is no longer need to quarantine unless you have a fever.  If you do have a fever then you need to quarantine until you have been fever free for 24 hours without taking Tylenol and/or ibuprofen.  Use over-the-counter Tylenol and/or ibuprofen according to the package instructions as needed for fever and pain.  Use the Atrovent nasal spray, 2 squirts up each nostril every 6 hours, as needed for nasal congestion and runny nose.  Use the Tessalon Perles every 8 hours during the day as needed for cough.  Take them with a small sip of water.  You may experience numbness to the base of your tongue or metallic taste in her mouth, this is normal.  Use the Promethazine DM cough syrup at bedtime as needed for cough and congestion.  Be mindful this medication will make you sleepy.  If you develop any worsening respiratory symptoms such as shortness of breath, shortness of breath at rest, feel as though you cannot catch your breath, you are unable to speak in full sentences, or, as a late sign, your lips begin turning blue you need to call 911 and go to the ER for evaluation.      ED Prescriptions     Medication Sig Dispense Auth. Provider   benzonatate (TESSALON) 200 MG capsule Take 1 capsule (200  mg total) by mouth 3 (three) times daily as needed for cough. 30 capsule Becky Augusta, NP   ipratropium (ATROVENT) 0.06 % nasal spray Place 2 sprays into both nostrils 4 (four) times daily. 15 mL Becky Augusta, NP   promethazine-dextromethorphan (PROMETHAZINE-DM) 6.25-15 MG/5ML syrup Take 5 mLs by mouth 4 (four) times daily as needed. 118 mL Becky Augusta, NP      PDMP not reviewed this encounter.   Becky Augusta, NP 04/04/23 [phone]

## 2023-04-05 ENCOUNTER — Ambulatory Visit: Payer: 59 | Admitting: Internal Medicine

## 2023-05-18 ENCOUNTER — Ambulatory Visit: Payer: 59 | Attending: Cardiology | Admitting: Cardiology

## 2023-05-18 ENCOUNTER — Encounter: Payer: Self-pay | Admitting: Cardiology

## 2023-05-18 VITALS — BP 110/66 | HR 78 | Ht 70.0 in | Wt 179.8 lb

## 2023-05-18 DIAGNOSIS — R072 Precordial pain: Secondary | ICD-10-CM

## 2023-05-18 DIAGNOSIS — I351 Nonrheumatic aortic (valve) insufficiency: Secondary | ICD-10-CM | POA: Diagnosis not present

## 2023-05-18 DIAGNOSIS — F17200 Nicotine dependence, unspecified, uncomplicated: Secondary | ICD-10-CM

## 2023-05-18 DIAGNOSIS — Z79899 Other long term (current) drug therapy: Secondary | ICD-10-CM

## 2023-05-18 MED ORDER — METOPROLOL TARTRATE 100 MG PO TABS: 100.0000 mg | ORAL_TABLET | Freq: Once | ORAL | 0 refills | Status: DC

## 2023-05-18 NOTE — Patient Instructions (Addendum)
 Medication Instructions:  Your Physician recommend you continue on your current medication as directed.     Metoprolol 100 mg 2 hours prior to Cardiac CT.   *If you need a refill on your cardiac medications before your next appointment, please call your pharmacy*  Lab Work: Your provider would like for you to have following labs drawn today BMP.    If you have labs (blood work) drawn today and your tests are completely normal, you will receive your results only by: MyChart Message (if you have MyChart) OR A paper copy in the mail If you have any lab test that is abnormal or we need to change your treatment, we will call you to review the results.  Testing/Procedures: Your physician has requested that you have an echocardiogram in 1 year. Echocardiography is a painless test that uses sound waves to create images of your heart. It provides your doctor with information about the size and shape of your heart and how well your heart's chambers and valves are working.   You may receive an ultrasound enhancing agent through an IV if needed to better visualize your heart during the echo. This procedure takes approximately one hour.  There are no restrictions for this procedure.  This will take place at [address] [facility] [address] [facility]) [address], [place] [address]  Please note: We ask at that you not bring children with you during ultrasound (echo/ vascular) testing. Due to room size and safety concerns, children are not allowed in the ultrasound rooms during exams. Our front office staff cannot provide observation of children in our lobby area while testing is being conducted. An adult accompanying a patient to their appointment will only be allowed in the ultrasound room at the discretion of the ultrasound technician under special circumstances. We apologize for any inconvenience.     Your cardiac CT will be scheduled at one of the below locations:    [facility] [address] [place], [place] [address] [phone]  OR   [facility] [address] [place], [place] [address] [phone]   If scheduled at [facility] or [facility], please arrive 15 mins early for check-in and test prep.  There is spacious parking and easy access to the radiology department from the [facility] entrance. Please enter here and check-in with the desk attendant.   Please follow these instructions carefully (unless otherwise directed):  An IV will be required for this test and Nitroglycerin will be given.  Hold all erectile dysfunction medications at least 3 days (72 hrs) prior to test. (Ie viagra, cialis, sildenafil, tadalafil, etc)   On the Night Before the Test: Be sure to Drink plenty of water. Do not consume any caffeinated/decaffeinated beverages or chocolate 12 hours prior to your test. Do not take any antihistamines 12 hours prior to your test.  On the Day of the Test: Drink plenty of water until 1 hour prior to the test. Do not eat any food 1 hour prior to test. You may take your regular medications prior to the test.  Take metoprolol (Lopressor) 100 MG two hours prior to test. If you take Furosemide/Hydrochlorothiazide/Spironolactone/Chlorthalidone, please HOLD on the morning of the test. Patients who wear a continuous glucose monitor MUST remove the device prior to scanning.      After the Test: Drink plenty of water. After receiving IV contrast, you may experience a mild flushed feeling. This  is normal. On occasion, you may experience a mild rash up to 24 hours after the test. This is not dangerous. If this occurs, you can take Benadryl 25 mg, Zyrtec, Claritin, or Allegra and increase your fluid intake. (Patients taking Tikosyn should avoid Benadryl, and may take Zyrtec, Claritin, or Allegra) If you experience trouble breathing, this  can be serious. If it is severe call 911 IMMEDIATELY. If it is mild, please call our office.  We will call to schedule your test 2-4 weeks out understanding that some insurance companies will need an authorization prior to the service being performed.   For more information and frequently asked questions, please visit our website : [email or web address]  For non-scheduling related questions, please contact the cardiac imaging nurse navigator should you have any questions/concerns: Cardiac Imaging Nurse Navigators Direct Office Dial: [phone]   For scheduling needs, including cancellations and rescheduling, please call [place], [phone].  Follow-Up: At [facility], you and your health needs are our priority.  As part of our continuing mission to provide you with exceptional heart care, our providers are all part of one team.  This team includes your primary Cardiologist (physician) and Advanced Practice Providers or APPs (Physician Assistants and Nurse Practitioners) who all work together to provide you with the care you need, when you need it.  Your next appointment:   3 month(s) 12 month(s)  Provider:   You may see Dr.Agbor-Etang or one of the following Advanced Practice Providers on your designated Care Team:   Nicolasa Ducking, NP Ames Dura, PA-C Eula Listen, PA-C Cadence Bagley, PA-C Charlsie Quest, NP Carlos Levering, NP    We recommend signing up for the patient portal called "MyChart".  Sign up information is provided on this After Visit Summary.  MyChart is used to connect with patients for Virtual Visits (Telemedicine).  Patients are able to view lab/test results, encounter notes, upcoming appointments, etc.  Non-urgent messages can be sent to your provider as well.   To learn more about what you can do with MyChart, go to [email or web address].

## 2023-05-18 NOTE — Progress Notes (Addendum)
 Cardiology Office Note:    Date:  05/18/2023   ID:  Donald Shepherd, DOB 1990/05/07, MRN [id number]  PCP:  Remo Lipps, PA   [facility] Providers Cardiologist:  None     Referring MD: Remo Lipps, PA   Chief Complaint  Patient presents with   Follow-up    Had some chest tightness about a month ago, lasted for about an hour   Donald Shepherd is a 33 y.o. male who is being seen today for the evaluation of chest pain at the request of Remo Lipps, [place].   History of Present Illness:    Donald Shepherd is a 33 y.o. male with a hx of anxiety, current smoker x 15 years, cardiac murmur presents due to systolic murmur.  Evaluated by PCP, systolic murmur noted on exam.  Echocardiogram was obtained 11/2022 showing normal EF, normal diastolic function, mild LVH.  Mild aortic regurgitation.  Had an episode of chest pain about a month ago lasting several weeks on and off.  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  Currently smokes.  Denies family history of heart disease or heart attacks.  Past Medical History:  Diagnosis Date   Allergy    Anxiety    Depression     Past Surgical History:  Procedure Laterality Date   SHOULDER SURGERY Right     Current Medications: Current Meds  Medication Sig   benzonatate (TESSALON) 200 MG capsule Take 1 capsule (200 mg total) by mouth 3 (three) times daily as needed for cough.   ipratropium (ATROVENT) 0.06 % nasal spray Place 2 sprays into both nostrils 4 (four) times daily.   metoprolol tartrate (LOPRESSOR) 100 MG tablet Take 1 tablet (100 mg total) by mouth once for 1 dose. Take two hours prior to cardiac CT.   promethazine-dextromethorphan (PROMETHAZINE-DM) 6.25-15 MG/5ML syrup Take 5 mLs by mouth 4 (four) times daily as needed.   propranolol (INDERAL) 40 MG tablet TAKE 1 TABLET(40 MG) BY MOUTH TWICE DAILY AS NEEDED FOR ANXIETY     Allergies:   Patient has  no known allergies.   Social History   Socioeconomic History   Marital status: Single    Spouse name: Not on file   Number of children: 0   Years of education: Not on file   Highest education level: 12th grade  Occupational History   Not on file  Tobacco Use   Smoking status: Every Day    Current packs/day: 1.00    Average packs/day: 1 pack/day for 16.3 years (16.3 ttl pk-yrs)    Types: Cigarettes    Start date: 02/15/2007   Smokeless tobacco: Current    Types: Snuff  Vaping Use   Vaping status: Never Used  Substance and Sexual Activity   Alcohol use: Yes    Alcohol/week: 4.0 standard drinks of alcohol    Types: 4 Cans of beer per week    Comment: 3-4 for a week the past month   Drug use: Not Currently    Types: Marijuana   Sexual activity: Not Currently    Birth control/protection: None  Other Topics Concern   Not on file  Social History Narrative   Not on file   Social Drivers of Health   Financial Resource Strain: Low Risk  (01/20/2023)   Overall Financial Resource Strain (CARDIA)    Difficulty of Paying Living Expenses: Not very hard  Food Insecurity: No Food Insecurity (01/20/2023)  Hunger Vital Sign    Worried About Running Out of Food in the Last Year: Never true    Ran Out of Food in the Last Year: Never true  Transportation Needs: No Transportation Needs (01/20/2023)   PRAPARE - Administrator, Civil Service (Medical): No    Lack of Transportation (Non-Medical): No  Physical Activity: Sufficiently Active (01/20/2023)   Exercise Vital Sign    Days of Exercise per Week: 5 days    Minutes of Exercise per Session: 120 min  Stress: Stress Concern Present (01/20/2023)   [employer] of Occupational Health - Occupational Stress Questionnaire    Feeling of Stress : To some extent  Social Connections: Socially Isolated (01/20/2023)   Social Connection and Isolation Panel [NHANES]    Frequency of Communication with Friends and Family: Three times a  week    Frequency of Social Gatherings with Friends and Family: Once a week    Attends Religious Services: Never    Database administrator or Organizations: No    Attends Engineer, structural: Not on file    Marital Status: Never married     Family History: The patient's family history includes Asthma in his mother; COPD in his maternal grandfather and mother; Cancer in his maternal grandfather; Diabetes in his father, paternal grandfather, and paternal uncle; Healthy in his father and mother; Stroke in his maternal grandfather and maternal grandmother.  ROS:   Please see the history of present illness.     All other systems reviewed and are negative.  EKGs/Labs/Other Studies Reviewed:    The following studies were reviewed today:  EKG Interpretation Date/Time:  Thursday May 18 2023 08:42:05 EDT Ventricular Rate:  78 PR Interval:  144 QRS Duration:  96 QT Interval:  350 QTC Calculation: 399 R Axis:   -30  Text Interpretation: Normal sinus rhythm Left axis deviation Possible Lateral infarct , age undetermined Confirmed by Debbe Odea ([id number]) on 05/18/2023 8:51:36 AM    Recent Labs: 10/24/2022: ALT 27; BUN 9; Creatinine, Ser 0.88; Hemoglobin 18.1; Platelets 264; Potassium 4.6; Sodium 138; TSH 0.921  Recent Lipid Panel No results found for: "CHOL", "TRIG", "HDL", "CHOLHDL", "VLDL", "LDLCALC", "LDLDIRECT"   Risk Assessment/Calculations:             Physical Exam:    VS:  BP 110/66 (BP Location: Left Arm, Patient Position: Sitting, Cuff Size: Normal)   Pulse 78   Ht 5\' 10"  (1.778 m)   Wt 179 lb 12.8 oz (81.6 kg)   SpO2 98%   BMI 25.80 kg/m     Wt Readings from Last 3 Encounters:  05/18/23 179 lb 12.8 oz (81.6 kg)  04/04/23 170 lb (77.1 kg)  01/23/23 180 lb (81.6 kg)     GEN:  Well nourished, well developed in no acute distress HEENT: Normal NECK: No JVD; No carotid bruits CARDIAC: RRR, 1/6 systolic murmur at left sternal border. RESPIRATORY:   Clear to auscultation without rales, wheezing or rhonchi  ABDOMEN: Soft, non-tender, non-distended MUSCULOSKELETAL:  No edema; No deformity  SKIN: Warm and dry NEUROLOGIC:  Alert and oriented x 3 PSYCHIATRIC:  Normal affect   ASSESSMENT:    1. Precordial pain   2. Aortic valve insufficiency, etiology of cardiac valve disease unspecified   3. Tobacco use disorder   4. Medication management    PLAN:    In order of problems listed above:  Chest pain, current smoker.  Echo with normal EF 55 to 60%.  Obtain  coronary CTA. Mild aortic valve regurgitation, monitor serially.  Repeat echo in 1 year.  If stable in 1 year, plan to reduce frequency of monitoring to every 3 to 5 years. Current smoker, smoking cessation advised.  Follow-up in 3 months after coronary CTA, okay to cancel 58-month appointment if coronary CTA is otherwise normal.     Medication Adjustments/Labs and Tests Ordered: Current medicines are reviewed at length with the patient today.  Concerns regarding medicines are outlined above.  Orders Placed This Encounter  Procedures   CT CORONARY MORPH W/CTA COR W/SCORE W/CA W/CM &/OR WO/CM   Basic metabolic panel with GFR   EKG [id number]   ECHOCARDIOGRAM COMPLETE   Meds ordered this encounter  Medications   metoprolol tartrate (LOPRESSOR) 100 MG tablet    Sig: Take 1 tablet (100 mg total) by mouth once for 1 dose. Take two hours prior to cardiac CT.    Dispense:  1 tablet    Refill:  0    Patient Instructions  Medication Instructions:  Your Physician recommend you continue on your current medication as directed.     Metoprolol 100 mg 2 hours prior to Cardiac CT.   *If you need a refill on your cardiac medications before your next appointment, please call your pharmacy*  Lab Work: Your provider would like for you to have following labs drawn today BMP.    If you have labs (blood work) drawn today and your tests are completely normal, you will receive your results only  by: MyChart Message (if you have MyChart) OR A paper copy in the mail If you have any lab test that is abnormal or we need to change your treatment, we will call you to review the results.  Testing/Procedures: Your physician has requested that you have an echocardiogram in 1 year. Echocardiography is a painless test that uses sound waves to create images of your heart. It provides your doctor with information about the size and shape of your heart and how well your heart's chambers and valves are working.   You may receive an ultrasound enhancing agent through an IV if needed to better visualize your heart during the echo. This procedure takes approximately one hour.  There are no restrictions for this procedure.  This will take place at [address] [facility], [place] [address]  Please note: We ask at that you not bring children with you during ultrasound (echo/ vascular) testing. Due to room size and safety concerns, children are not allowed in the ultrasound rooms during exams. Our front office staff cannot provide observation of children in our lobby area while testing is being conducted. An adult accompanying a patient to their appointment will only be allowed in the ultrasound room at the discretion of the ultrasound technician under special circumstances. We apologize for any inconvenience.     Your cardiac CT will be scheduled at one of the below locations:    [facility] [address] [place], [place] [address] [phone]  OR   [facility] [address] [place], [place] [address] [phone]   If scheduled at [facility] or [facility], please arrive 15 mins early for check-in and test prep.  There is spacious parking and easy access to the radiology department from the [facility] entrance. Please enter here and  check-in with the desk attendant.   Please follow these instructions carefully (unless otherwise  directed):  An IV will be required for this test and Nitroglycerin will be given.  Hold all erectile dysfunction medications at least 3 days (72 hrs) prior to test. (Ie viagra, cialis, sildenafil, tadalafil, etc)   On the Night Before the Test: Be sure to Drink plenty of water. Do not consume any caffeinated/decaffeinated beverages or chocolate 12 hours prior to your test. Do not take any antihistamines 12 hours prior to your test.  On the Day of the Test: Drink plenty of water until 1 hour prior to the test. Do not eat any food 1 hour prior to test. You may take your regular medications prior to the test.  Take metoprolol (Lopressor) 100 MG two hours prior to test. If you take Furosemide/Hydrochlorothiazide/Spironolactone/Chlorthalidone, please HOLD on the morning of the test. Patients who wear a continuous glucose monitor MUST remove the device prior to scanning.      After the Test: Drink plenty of water. After receiving IV contrast, you may experience a mild flushed feeling. This is normal. On occasion, you may experience a mild rash up to 24 hours after the test. This is not dangerous. If this occurs, you can take Benadryl 25 mg, Zyrtec, Claritin, or Allegra and increase your fluid intake. (Patients taking Tikosyn should avoid Benadryl, and may take Zyrtec, Claritin, or Allegra) If you experience trouble breathing, this can be serious. If it is severe call 911 IMMEDIATELY. If it is mild, please call our office.  We will call to schedule your test 2-4 weeks out understanding that some insurance companies will need an authorization prior to the service being performed.   For more information and frequently asked questions, please visit our website : [email or web address]  For non-scheduling related questions, please contact the cardiac imaging nurse navigator should you have  any questions/concerns: Cardiac Imaging Nurse Navigators Direct Office Dial: [phone]   For scheduling needs, including cancellations and rescheduling, please call [place], [phone].  Follow-Up: At [facility], you and your health needs are our priority.  As part of our continuing mission to provide you with exceptional heart care, our providers are all part of one team.  This team includes your primary Cardiologist (physician) and Advanced Practice Providers or APPs (Physician Assistants and Nurse Practitioners) who all work together to provide you with the care you need, when you need it.  Your next appointment:   3 month(s) 12 month(s)  Provider:   You may see Dr.Agbor-Etang or one of the following Advanced Practice Providers on your designated Care Team:   Nicolasa Ducking, NP Ames Dura, PA-C Eula Listen, PA-C Cadence Holualoa, PA-C Charlsie Quest, NP Carlos Levering, NP    We recommend signing up for the patient portal called "MyChart".  Sign up information is provided on this After Visit Summary.  MyChart is used to connect with patients for Virtual Visits (Telemedicine).  Patients are able to view lab/test results, encounter notes, upcoming appointments, etc.  Non-urgent messages can be sent to your provider as well.   To learn more about what you can do with MyChart, go to [email or web address].            Signed, Debbe Odea, MD  05/18/2023 9:26 AM    [facility]

## 2023-05-19 LAB — BASIC METABOLIC PANEL WITH GFR
BUN/Creatinine Ratio: 10 (ref 9–20)
BUN: 12 mg/dL (ref 6–20)
CO2: 23 mmol/L (ref 20–29)
Calcium: 10.2 mg/dL (ref 8.7–10.2)
Chloride: 101 mmol/L (ref 96–106)
Creatinine, Ser: 1.15 mg/dL (ref 0.76–1.27)
Glucose: 81 mg/dL (ref 70–99)
Potassium: 4.9 mmol/L (ref 3.5–5.2)
Sodium: 139 mmol/L (ref 134–144)
eGFR: 86 mL/min/{1.73_m2} (ref >59)

## 2023-05-23 ENCOUNTER — Other Ambulatory Visit: Payer: Self-pay

## 2023-05-23 ENCOUNTER — Encounter (HOSPITAL_COMMUNITY): Payer: Self-pay

## 2023-05-23 MED ORDER — METOPROLOL TARTRATE 100 MG PO TABS: 100.0000 mg | ORAL_TABLET | Freq: Once | ORAL | 0 refills | Status: DC

## 2023-05-23 MED ORDER — METOPROLOL TARTRATE 100 MG PO TABS: 100.0000 mg | ORAL_TABLET | Freq: Once | ORAL | 0 refills | Status: AC

## 2023-05-25 ENCOUNTER — Ambulatory Visit
Admission: RE | Admit: 2023-05-25 | Discharge: 2023-05-25 | Disposition: A | Discharge status: Home / Self Care | Source: Ambulatory Visit | Attending: Cardiology | Admitting: Cardiology

## 2023-05-25 DIAGNOSIS — R072 Precordial pain: Secondary | ICD-10-CM | POA: Insufficient documentation

## 2023-05-25 MED ORDER — SODIUM CHLORIDE 0.9 % IV SOLN: INTRAVENOUS | Status: DC

## 2023-05-25 MED ORDER — NITROGLYCERIN 0.4 MG SL SUBL
0.8000 mg | SUBLINGUAL_TABLET | Freq: Once | SUBLINGUAL | Status: AC
  Administered 2023-05-25: 0.8 mg via SUBLINGUAL

## 2023-05-25 MED ORDER — METOPROLOL TARTRATE 5 MG/5ML IV SOLN
10.0000 mg | Freq: Once | INTRAVENOUS | Status: AC | PRN
  Administered 2023-05-25: 10 mg via INTRAVENOUS

## 2023-05-25 MED ORDER — DILTIAZEM HCL 25 MG/5ML IV SOLN: 10.0000 mg | INTRAVENOUS | Status: DC | PRN

## 2023-05-25 MED ORDER — ONDANSETRON HCL 4 MG/2ML IJ SOLN
4.0000 mg | Freq: Once | INTRAMUSCULAR | Status: AC
  Administered 2023-05-25: 4 mg via INTRAVENOUS

## 2023-05-25 MED ORDER — IOHEXOL 350 MG/ML SOLN
75.0000 mL | Freq: Once | INTRAVENOUS | Status: AC | PRN
  Administered 2023-05-25: 75 mL via INTRAVENOUS

## 2023-05-25 NOTE — Progress Notes (Signed)
 Patient developed nausea after Cardiac CT. Administered Zofran 4 mg IVP.  Drank water after. Vital signs stable encourage to drink water throughout day  Denies nausea and also gave ginger ale soda .Reasons explained and verbalized understanding. Ambulated steady gait.

## 2023-08-17 ENCOUNTER — Ambulatory Visit: Admitting: Cardiology

## 2024-05-14 ENCOUNTER — Other Ambulatory Visit
